# Patient Record
Sex: Female | Born: 1955 | Race: White | Hispanic: No | Marital: Married | Smoking: Never smoker
Health system: Southern US, Community
[De-identification: ages and names within clinical notes are randomized; demographics above are authoritative.]

## PROBLEM LIST (undated history)

## (undated) DIAGNOSIS — E119 Type 2 diabetes mellitus without complications: Secondary | ICD-10-CM

## (undated) DIAGNOSIS — E785 Hyperlipidemia, unspecified: Secondary | ICD-10-CM

## (undated) DIAGNOSIS — K900 Celiac disease: Secondary | ICD-10-CM

## (undated) DIAGNOSIS — K5090 Crohn's disease, unspecified, without complications: Secondary | ICD-10-CM

## (undated) HISTORY — DX: Crohn's disease, unspecified, without complications: K50.90

## (undated) HISTORY — DX: Hyperlipidemia, unspecified: E78.5

## (undated) HISTORY — DX: Celiac disease: K90.0

## (undated) HISTORY — PX: TONSILLECTOMY: SUR1361

## (undated) HISTORY — DX: Type 2 diabetes mellitus without complications: E11.9

---

## 1998-11-17 ENCOUNTER — Other Ambulatory Visit: Admission: RE | Admit: 1998-11-17 | Discharge: 1998-11-17 | Payer: Self-pay | Admitting: Gynecology

## 2000-10-24 ENCOUNTER — Other Ambulatory Visit: Admission: RE | Admit: 2000-10-24 | Discharge: 2000-10-24 | Payer: Self-pay | Admitting: Gynecology

## 2002-10-19 ENCOUNTER — Other Ambulatory Visit: Admission: RE | Admit: 2002-10-19 | Discharge: 2002-10-19 | Payer: Self-pay | Admitting: Gynecology

## 2004-05-02 ENCOUNTER — Other Ambulatory Visit: Admission: RE | Admit: 2004-05-02 | Discharge: 2004-05-02 | Payer: Self-pay | Admitting: Gynecology

## 2005-06-25 ENCOUNTER — Other Ambulatory Visit: Admission: RE | Admit: 2005-06-25 | Discharge: 2005-06-25 | Payer: Self-pay | Admitting: Gynecology

## 2006-07-09 ENCOUNTER — Other Ambulatory Visit: Admission: RE | Admit: 2006-07-09 | Discharge: 2006-07-09 | Payer: Self-pay | Admitting: Gynecology

## 2006-10-27 ENCOUNTER — Ambulatory Visit: Payer: Self-pay | Admitting: Internal Medicine

## 2006-11-14 ENCOUNTER — Ambulatory Visit (HOSPITAL_COMMUNITY): Admission: RE | Admit: 2006-11-14 | Discharge: 2006-11-14 | Payer: Self-pay | Admitting: Gastroenterology

## 2007-07-21 ENCOUNTER — Other Ambulatory Visit: Admission: RE | Admit: 2007-07-21 | Discharge: 2007-07-21 | Payer: Self-pay | Admitting: Gynecology

## 2008-08-15 ENCOUNTER — Other Ambulatory Visit: Admission: RE | Admit: 2008-08-15 | Discharge: 2008-08-15 | Payer: Self-pay | Admitting: Gynecology

## 2008-11-01 ENCOUNTER — Ambulatory Visit: Payer: Self-pay | Admitting: Gynecology

## 2008-11-09 ENCOUNTER — Ambulatory Visit: Payer: Self-pay | Admitting: Gynecology

## 2008-11-21 ENCOUNTER — Ambulatory Visit: Payer: Self-pay | Admitting: Gynecology

## 2009-08-25 ENCOUNTER — Encounter: Payer: Self-pay | Admitting: Gynecology

## 2009-08-25 ENCOUNTER — Ambulatory Visit: Payer: Self-pay | Admitting: Gynecology

## 2009-08-25 ENCOUNTER — Other Ambulatory Visit: Admission: RE | Admit: 2009-08-25 | Discharge: 2009-08-25 | Payer: Self-pay | Admitting: Gynecology

## 2009-10-27 ENCOUNTER — Ambulatory Visit: Payer: Self-pay | Admitting: Gynecology

## 2009-11-14 ENCOUNTER — Ambulatory Visit: Payer: Self-pay | Admitting: Gynecology

## 2009-11-14 ENCOUNTER — Encounter (INDEPENDENT_AMBULATORY_CARE_PROVIDER_SITE_OTHER): Payer: Self-pay | Admitting: *Deleted

## 2009-11-14 ENCOUNTER — Encounter: Payer: Self-pay | Admitting: Family

## 2009-11-14 LAB — CONVERTED CEMR LAB
Cholesterol: 276 mg/dL
HDL: 34 mg/dL
MCH: 29.3 pg
MCV: 86.5 fL
Platelets: 494 10*3/uL
Triglycerides: 291 mg/dL
WBC, blood: 5.5 10*3/uL

## 2009-11-21 ENCOUNTER — Encounter (INDEPENDENT_AMBULATORY_CARE_PROVIDER_SITE_OTHER): Payer: Self-pay | Admitting: *Deleted

## 2009-12-04 ENCOUNTER — Ambulatory Visit: Payer: Self-pay | Admitting: Family

## 2009-12-04 ENCOUNTER — Encounter: Payer: Self-pay | Admitting: Family Medicine

## 2009-12-04 DIAGNOSIS — L259 Unspecified contact dermatitis, unspecified cause: Secondary | ICD-10-CM

## 2009-12-04 DIAGNOSIS — E785 Hyperlipidemia, unspecified: Secondary | ICD-10-CM | POA: Insufficient documentation

## 2009-12-04 DIAGNOSIS — K900 Celiac disease: Secondary | ICD-10-CM

## 2009-12-04 LAB — CONVERTED CEMR LAB
AST: 27 units/L (ref 0–37)
CO2: 27 meq/L (ref 19–32)
Chloride: 104 meq/L (ref 96–112)
GFR calc non Af Amer: 92.69 mL/min (ref >60)
Glucose, Bld: 103 mg/dL — ABNORMAL HIGH (ref 70–99)
Potassium: 4.9 meq/L (ref 3.5–5.1)

## 2010-02-14 ENCOUNTER — Telehealth (INDEPENDENT_AMBULATORY_CARE_PROVIDER_SITE_OTHER): Payer: Self-pay | Admitting: *Deleted

## 2010-02-26 ENCOUNTER — Ambulatory Visit: Payer: Self-pay | Admitting: Family Medicine

## 2010-02-26 DIAGNOSIS — R5383 Other fatigue: Secondary | ICD-10-CM

## 2010-02-26 DIAGNOSIS — R5381 Other malaise: Secondary | ICD-10-CM

## 2010-02-27 ENCOUNTER — Ambulatory Visit: Payer: Self-pay | Admitting: Family Medicine

## 2010-03-08 ENCOUNTER — Telehealth (INDEPENDENT_AMBULATORY_CARE_PROVIDER_SITE_OTHER): Payer: Self-pay | Admitting: *Deleted

## 2010-03-08 LAB — CONVERTED CEMR LAB
ALT: 40 units/L — ABNORMAL HIGH (ref 0–35)
Alkaline Phosphatase: 66 units/L (ref 39–117)
Basophils Absolute: 0 10*3/uL (ref 0.0–0.1)
Basophils Relative: 0.8 % (ref 0.0–3.0)
Cholesterol: 175 mg/dL (ref 0–200)
Eosinophils Absolute: 0.2 10*3/uL (ref 0.0–0.7)
Ferritin: 12.8 ng/mL (ref 10.0–291.0)
HCT: 40.2 % (ref 36.0–46.0)
Hemoglobin: 13.2 g/dL (ref 12.0–15.0)
LDL Cholesterol: 94 mg/dL (ref 0–99)
Lymphs Abs: 1.7 10*3/uL (ref 0.7–4.0)
MCHC: 32.9 g/dL (ref 30.0–36.0)
Monocytes Absolute: 0.6 10*3/uL (ref 0.1–1.0)
Monocytes Relative: 9.9 % (ref 3.0–12.0)
Platelets: 416 10*3/uL — ABNORMAL HIGH (ref 150.0–400.0)
Total CHOL/HDL Ratio: 4
Total Protein: 7 g/dL (ref 6.0–8.3)
WBC: 6.2 10*3/uL (ref 4.5–10.5)

## 2010-03-09 ENCOUNTER — Telehealth: Payer: Self-pay | Admitting: Family Medicine

## 2010-04-22 ENCOUNTER — Emergency Department (HOSPITAL_COMMUNITY): Admission: EM | Admit: 2010-04-22 | Discharge: 2010-04-22 | Payer: Self-pay | Admitting: Emergency Medicine

## 2010-05-22 ENCOUNTER — Telehealth (INDEPENDENT_AMBULATORY_CARE_PROVIDER_SITE_OTHER): Payer: Self-pay | Admitting: *Deleted

## 2010-05-29 ENCOUNTER — Telehealth (INDEPENDENT_AMBULATORY_CARE_PROVIDER_SITE_OTHER): Payer: Self-pay | Admitting: *Deleted

## 2010-06-19 ENCOUNTER — Ambulatory Visit: Payer: Self-pay | Admitting: Family Medicine

## 2010-06-20 LAB — CONVERTED CEMR LAB
ALT: 32 units/L (ref 0–35)
Albumin: 4.5 g/dL (ref 3.5–5.2)
Alkaline Phosphatase: 66 units/L (ref 39–117)
Bilirubin, Direct: 0.1 mg/dL (ref 0.0–0.3)
HDL: 42.9 mg/dL (ref >39.00)
LDL Cholesterol: 97 mg/dL (ref 0–99)
Total Bilirubin: 0.3 mg/dL (ref 0.3–1.2)
Total CHOL/HDL Ratio: 4
Triglycerides: 83 mg/dL (ref 0.0–149.0)

## 2010-06-25 ENCOUNTER — Telehealth (INDEPENDENT_AMBULATORY_CARE_PROVIDER_SITE_OTHER): Payer: Self-pay | Admitting: *Deleted

## 2010-06-26 IMAGING — CR DG LUMBAR SPINE COMPLETE 4+V
5 series · 5 of 5 positions shown · non-contrast
Comparison: None

CLINICAL DATA: Back pain

LUMBAR SPINE - COMPLETE 4+ VIEW

[t l-spine a.p.]
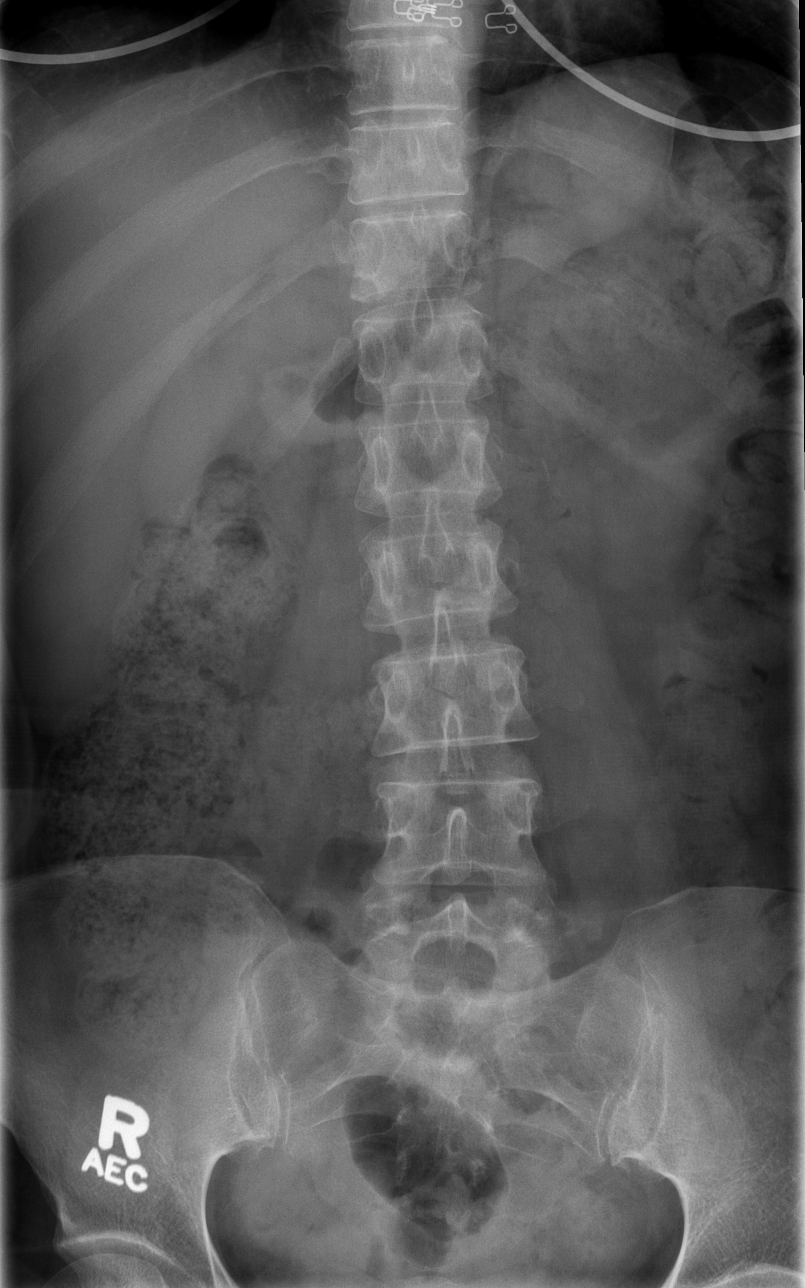

[t l-spine oblique exposure (1 of 2)]
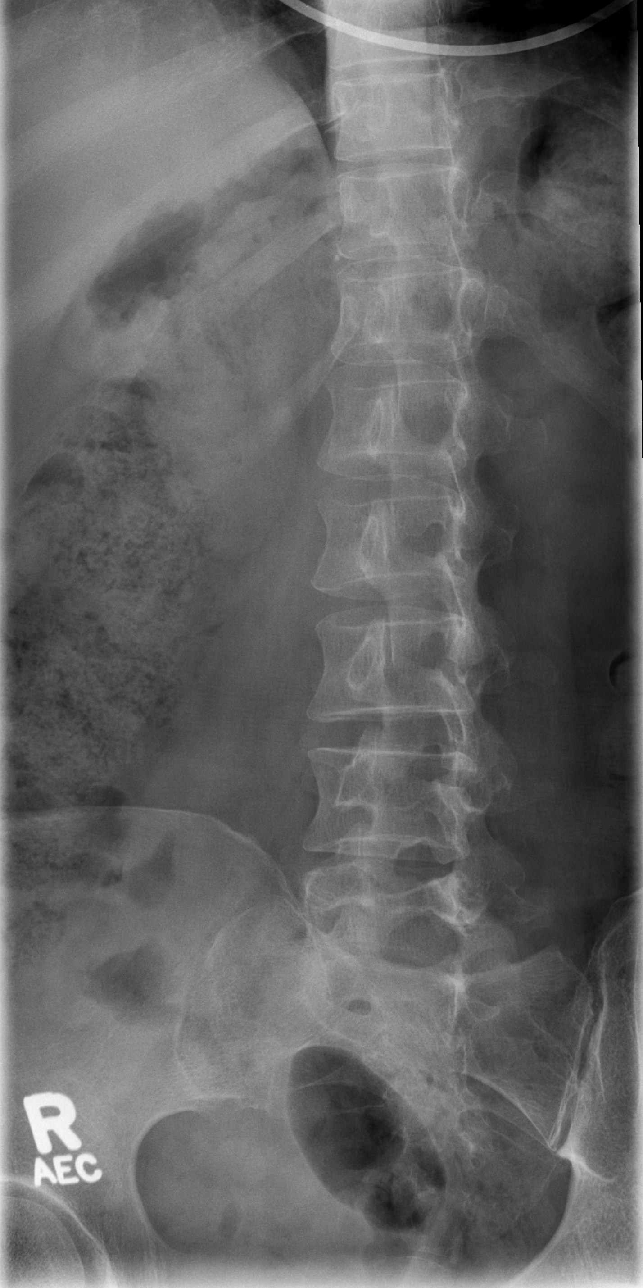

[t l-spine oblique exposure (2 of 2)]
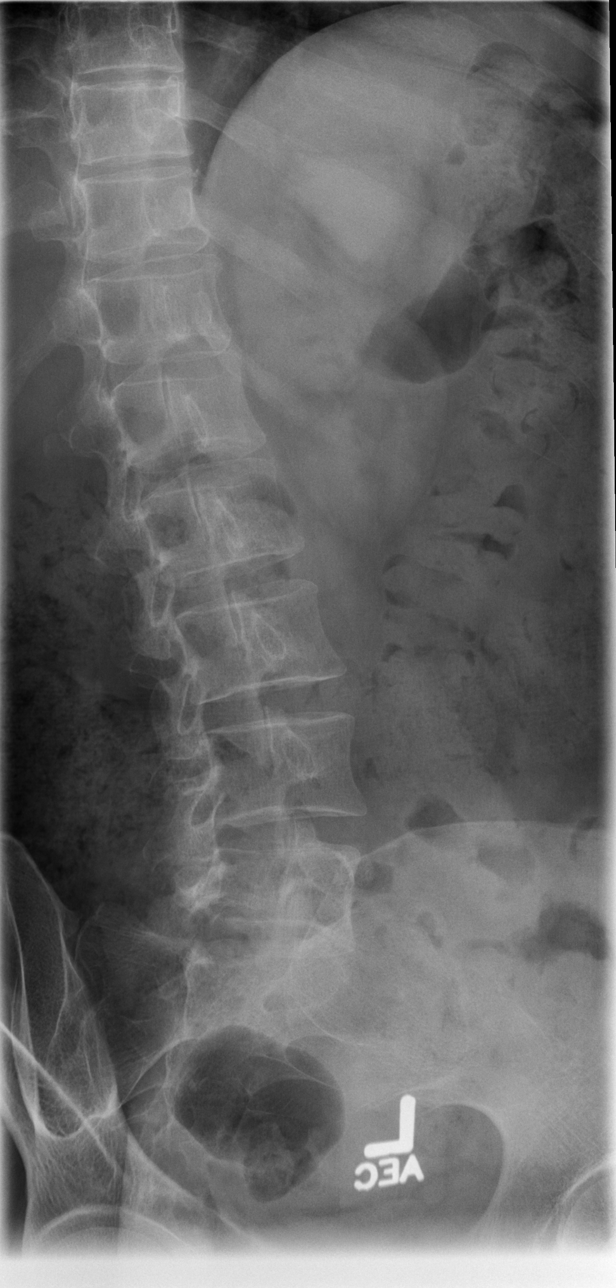

[t l-spine lat]
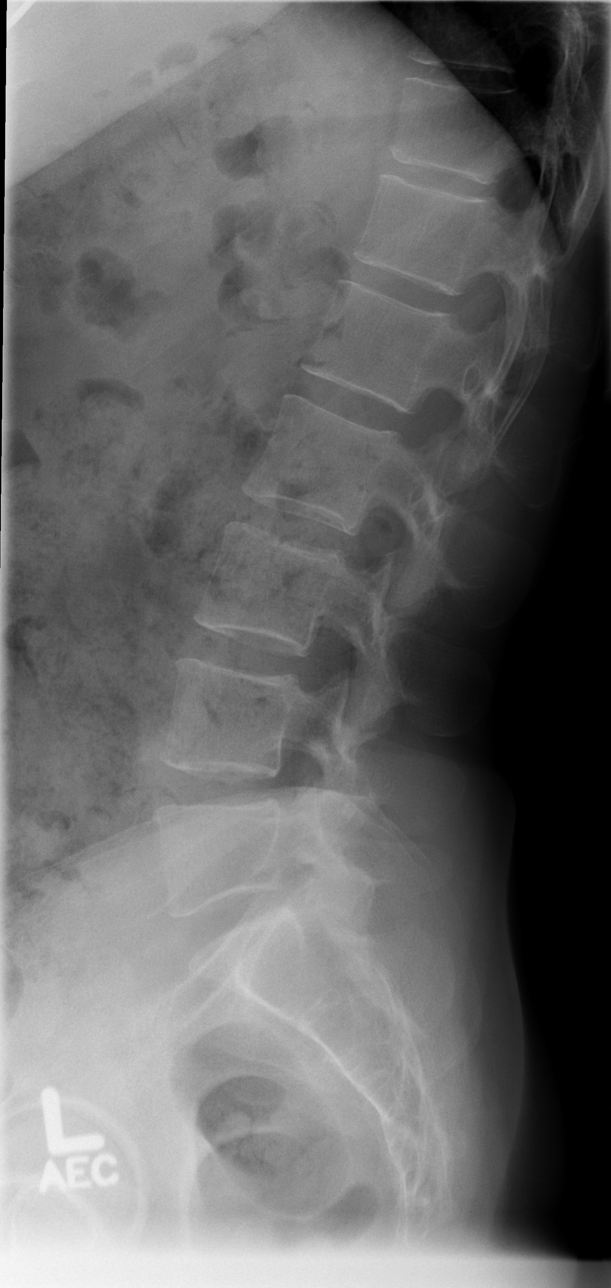

[t l-spine l5-s1 spot]
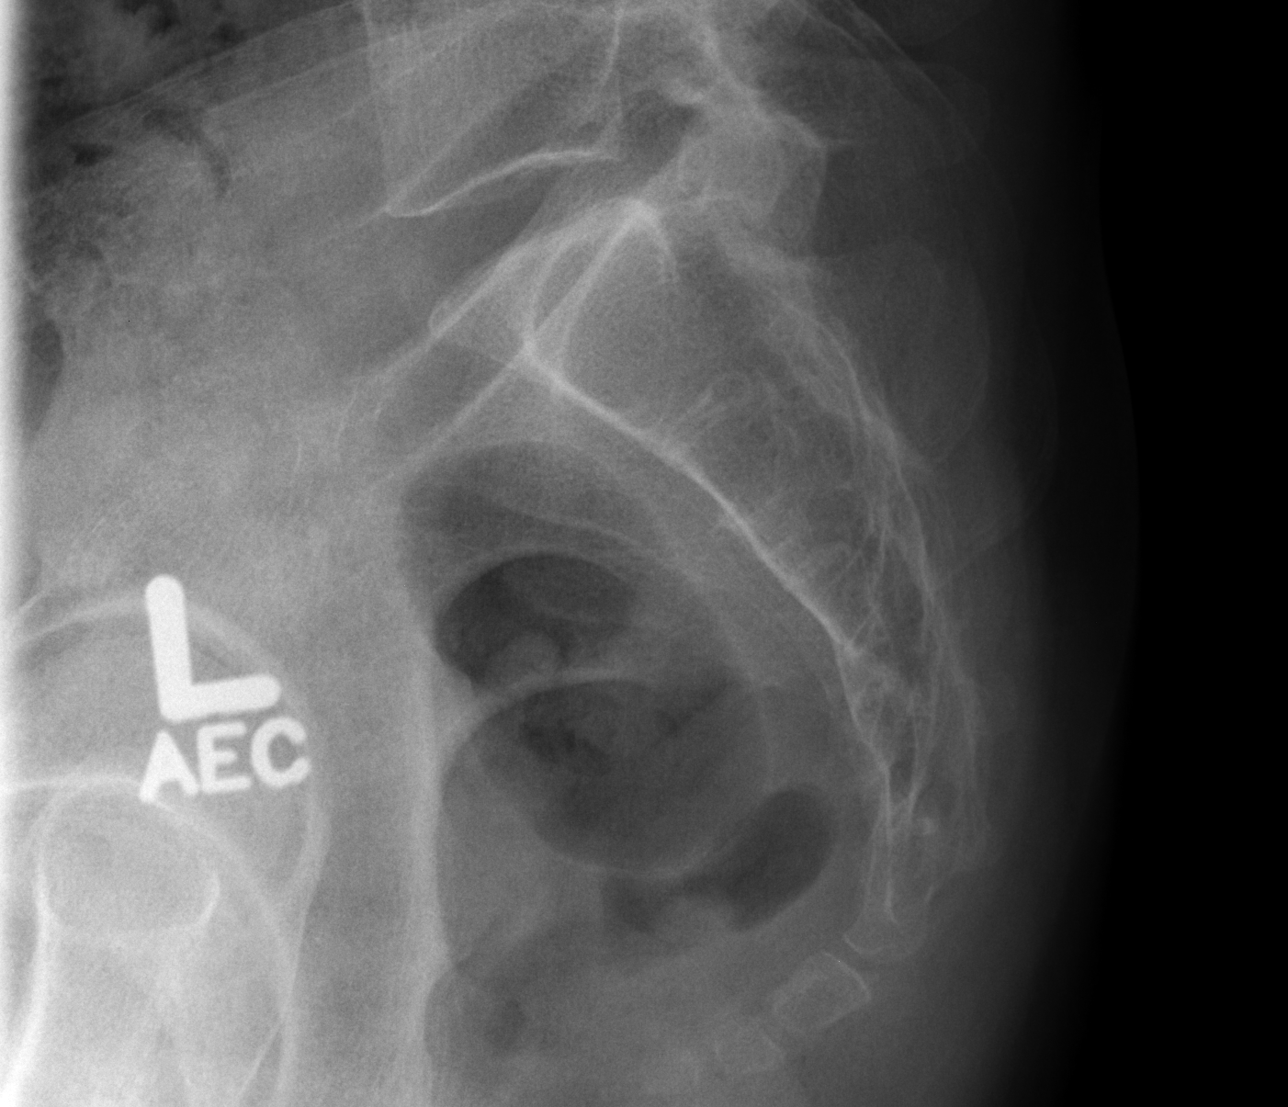

[5 of 5 positions shown; findings below may reference images not displayed]

FINDINGS: There is a mild curvature of the lumbar spine which is
convex to the left.

The vertebral body heights and disc spaces are well preserved.

No fractures or dislocations identified.
IMPRESSION: 1.  No acute findings.

## 2010-07-24 ENCOUNTER — Telehealth (INDEPENDENT_AMBULATORY_CARE_PROVIDER_SITE_OTHER): Payer: Self-pay | Admitting: *Deleted

## 2010-08-29 ENCOUNTER — Encounter: Payer: Self-pay | Admitting: Family Medicine

## 2010-08-29 ENCOUNTER — Encounter (INDEPENDENT_AMBULATORY_CARE_PROVIDER_SITE_OTHER): Payer: Self-pay | Admitting: *Deleted

## 2010-08-29 ENCOUNTER — Other Ambulatory Visit: Admission: RE | Admit: 2010-08-29 | Discharge: 2010-08-29 | Payer: Self-pay | Admitting: Gynecology

## 2010-08-29 ENCOUNTER — Ambulatory Visit: Payer: Self-pay | Admitting: Gynecology

## 2010-08-29 LAB — CONVERTED CEMR LAB
Glucose, Bld: 105 mg/dL
MCV: 88.6 fL
TSH: 1.65 microintl units/mL
WBC, blood: 7.2 10*3/uL

## 2010-09-06 ENCOUNTER — Encounter (INDEPENDENT_AMBULATORY_CARE_PROVIDER_SITE_OTHER): Payer: Self-pay | Admitting: *Deleted

## 2010-12-25 ENCOUNTER — Ambulatory Visit: Payer: Self-pay | Admitting: Family Medicine

## 2011-01-14 ENCOUNTER — Ambulatory Visit: Admit: 2011-01-14 | Payer: Self-pay | Admitting: Family Medicine

## 2011-01-29 NOTE — Assessment & Plan Note (Signed)
Summary: NEW PT AND MED REFILL//PH   Vital Signs:  Patient profile:   55 year old female Weight:      138.13 pounds Temp:     98.2 degrees F oral Pulse rate:   78 / minute Pulse rhythm:   regular BP sitting:   118 / 74  (left arm) Cuff size:   large  Vitals Entered By: Army Fossa CMA (February 26, 2010 2:18 PM) CC: Pt here to discuss Cholesterol readlings- Melissa started her on Zocor.   History of Present Illness: Pt here to have cholesterol checked but she is not fasting.  Pt also c/o fatigue.  No other complaints.    Current Medications (verified): 1)  Actoplus Met 15-500 Mg Tabs (Pioglitazone Hcl-Metformin Hcl) .Marland Kitchen.. 1 Tab By Mouth Once A Month 2)  Daily Multiple Vitamins  Tabs (Multiple Vitamin) .Marland Kitchen.. 1 Tab By Mouth Daily 3)  Caltrate 600+d 600-400 Mg-Unit Tabs (Calcium Carbonate-Vitamin D) .Marland Kitchen.. 1 Tab By Mouth Daily 4)  Glucosamine-Chondroitin 500-400 Mg Caps (Glucosamine-Chondroitin) .Marland Kitchen.. 1or 2 A Day By Mouth For Joint Pain 5)  Zocor 20 Mg Tabs (Simvastatin) .... One Tablet By Mouth Daily  Allergies (verified): No Known Drug Allergies  Past History:  Social History: Last updated: 12/04/2009 Works at [employer] of [employer] for [employer] never smoked two daughters Married does not drink alcohol no drugs  Past medical, surgical, family and social histories (including risk factors) reviewed for relevance to current acute and chronic problems.  Past Medical History: Reviewed history from 12/04/2009 and no changes required. 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 tonsillectomy age 25  Family History: Reviewed history from 12/04/2009 and no changes required. Mom- Living,  breast CA, now undergoing evaluation for ? COPD, HTN Dad- deceased, history of Mitral valve  replacement- died from stroke following a mitral valve replacement, age 57, HTN 2Brothers- younger brother overweight Sister- Healthy 2 daughters- alive and well  Social History: Reviewed history from 12/04/2009 and no changes required. Works at [employer] for [employer] never smoked two daughters Married does not drink alcohol no drugs  Review of Systems      See HPI  Physical Exam  General:  Well-developed,well-nourished,in no acute distress; alert,appropriate and cooperative throughout examination Psych:  Cognition and judgment appear intact. Alert and cooperative with normal attention span and concentration. No apparent delusions, illusions, hallucinations   Impression & Recommendations:  Problem # 1:  HYPERLIPIDEMIA (ICD-272.4) check fasting labs Her updated medication list for this problem includes:    Zocor 20 Mg Tabs (Simvastatin) ..... One tablet by mouth daily  Labs Reviewed: SGOT: 27 (12/04/2009)   SGPT: 27 (12/04/2009)   HDL:34 (11/14/2009)  LDL:184 (11/14/2009)  Chol:276 (11/14/2009)  Trig:291 (11/14/2009)  Problem # 2:  FATIGUE (ICD-780.79) check labs  Complete Medication List: 1)  Actoplus Met 15-500 Mg Tabs (Pioglitazone hcl-metformin hcl) .Marland Kitchen.. 1 tab by mouth once a month 2)  Daily Multiple Vitamins Tabs (Multiple vitamin) .Marland Kitchen.. 1 tab by mouth daily 3)  Caltrate 600+d 600-400 Mg-unit Tabs (Calcium carbonate-vitamin d) .Marland Kitchen.. 1 tab by mouth daily 4)  Glucosamine-chondroitin 500-400 Mg Caps (Glucosamine-chondroitin) .Marland Kitchen.. 1or 2 a day by mouth for joint pain 5)  Zocor 20 Mg Tabs (Simvastatin) .... One tablet by mouth daily  Patient Instructions: 1)  fasting labs at [facility]---- 272.4  lipid, hep 780.79  cbcd,  tsh, b12, ibc, ferritin

## 2011-01-29 NOTE — Progress Notes (Signed)
Summary: ? possible med interaction  Phone Note Call from Patient Call back at [phone]   Caller: Patient Summary of Call: pt want to make sure it is ok to take but the zocor and the tricor at the same time. Pt wants to know if this will have any effect on her celiac disease as well.per pt ok to leace VM. pls advise.........Marland KitchenFelecia Deloach CMA  March 09, 2010 1:00 PM   Follow-up for Phone Call        yes we give those together all the time .  Should not bother celiac. Follow-up by: Loreen Freud DO,  March 09, 2010 1:03 PM  Additional Follow-up for Phone Call Additional follow up Details #1::        left pt detail message..............Marland KitchenFelecia Deloach CMA  March 09, 2010 2:20 PM

## 2011-01-29 NOTE — Progress Notes (Signed)
Summary: zocor refill   Phone Note Refill Request Message from:  Fax from Pharmacy on February 14, 2010 8:15 AM  Refills Requested: Medication #1:  simvastatin 20 mg tablet   Brand Name Necessary? No   Supply Requested: 1 month   Last Refilled: 01/02/2010  Method Requested: Electronic Next Appointment Scheduled: 2-28- 215  Dr Laury Axon Initial call taken by: Roselle Locus,  February 14, 2010 8:15 AM    Prescriptions: ZOCOR 20 MG TABS (SIMVASTATIN) one tablet by mouth daily  #30 x 1   Entered by:   Doristine Devoid   Authorized by:   Lemont Fillers FNP   Signed by:   Doristine Devoid on 02/14/2010   Method used:   Electronically to        [id number]       [address]       [place], [place]  [address]       Ph: [phone] or [phone]       Fax: [phone]   RxID:   [id number]

## 2011-01-29 NOTE — Progress Notes (Signed)
Summary: REFILL REQUEST  Phone Note Refill Request Call back at [phone] Message from:  Pharmacy on June 25, 2010 3:17 PM  Refills Requested: Medication #1:  ZOCOR 20 MG TABS one tablet by mouth daily   Dosage confirmed as above?Dosage Confirmed   Supply Requested: 1 month   Last Refilled: 05/22/2010 [facility]  Next Appointment Scheduled: NONE Initial call taken by: Lavell Islam,  June 25, 2010 3:17 PM    Prescriptions: ZOCOR 20 MG TABS (SIMVASTATIN) one tablet by mouth daily  #30 x 11   Entered by:   Shonna Chock   Authorized by:   Neena Rhymes MD   Signed by:   Shonna Chock on 06/25/2010   Method used:   Electronically to        [id number] ([facility]       [address]       [place], [place]  [address]       Ph: [phone] or [phone]       Fax: [phone]   RxID:   [id number]

## 2011-01-29 NOTE — Progress Notes (Signed)
Summary: tricor refill   Phone Note Refill Request Call back at [phone] Message from:  Pharmacy on May 29, 2010 9:14 AM  Refills Requested: Medication #1:  TRICOR 145 MG TABS Take 1 by mouth  once daily.   Dosage confirmed as above?Dosage Confirmed   Supply Requested: 3 months   Notes: Pt needs a 90 supply for insuance to cover [facility] [address]   Next Appointment Scheduled: 6.21.11 Initial call taken by: Harold Barban,  May 29, 2010 9:14 AM    Prescriptions: TRICOR 145 MG TABS (FENOFIBRATE) Take 1 by mouth  once daily  #90 x 0   Entered by:   Doristine Devoid   Authorized by:   Neena Rhymes MD   Signed by:   Doristine Devoid on 05/29/2010   Method used:   Electronically to        [id number]       [address]       [place], [place]  [address]       Ph: [phone] or [phone]       Fax: [phone]   RxID:   [id number]

## 2011-01-29 NOTE — Miscellaneous (Signed)
Summary: labs from Dr. Lily Peer  Clinical Lists Changes  Observations: Added new observation of VIT D 1,25OH: 24  (08/29/2010 15:00) Added new observation of TSH: 1.65 microintl units/mL (08/29/2010 15:00) Added new observation of GLUCOSE SER: 105 mg/dL (09/81/1914 78:29) Added new observation of PLATELETS: 466 10*3/mm3 (08/29/2010 15:00) Added new observation of MCH: 30.1 pg (08/29/2010 15:00) Added new observation of MCV: 88.6 fL (08/29/2010 15:00) Added new observation of HCT: 41.7 % (08/29/2010 15:00) Added new observation of HGB: 14.2 g/dL (56/21/3086 57:84) Added new observation of RBC: 4.71 10*6/mm3 (08/29/2010 15:00) Added new observation of WBC: 7.2 10*3/mm3 (08/29/2010 15:00)

## 2011-01-29 NOTE — Progress Notes (Signed)
Summary: Lab Results (lmom 3/10)  Phone Note Outgoing Call   Call placed by: Army Fossa CMA,  March 08, 2010 8:34 AM Summary of Call: Regarding lab results, LMTCB:  Ideally your LDL (bad cholesterol) should be <__100__, your HDL (good cholesterol) should be >__40_ and your triglycerides should be< 150.  Diet and exercise will increase HDL and decrease the LDL and triglycerides. Read Dr. Danice Goltz book--Eat Drink and Be Healthy. con't zocor--- add tricor 145 mg #30 1 by mouth  once daily ,  2 refills.  We will recheck labs in _3__ months.  lipid, hep,  272.4 Signed by Loreen Freud DO on 03/07/2010 at 8:09 PM   Follow-up for Phone Call        pt aware, letter mailed, rx sent to pharmacy..............Marland KitchenFelecia Deloach CMA  March 09, 2010 9:29 AM     New/Updated Medications: TRICOR 145 MG TABS (FENOFIBRATE) Take 1 by mouth  once daily Prescriptions: TRICOR 145 MG TABS (FENOFIBRATE) Take 1 by mouth  once daily  #30 x 2   Entered by:   Jeremy Johann CMA   Authorized by:   Loreen Freud DO   Signed by:   Jeremy Johann CMA on 03/09/2010   Method used:   Faxed to ...       [id number]       [address]       [place], [place]  [address]       Ph: [phone] or [phone]       Fax: [phone]   RxID:   [id number]

## 2011-01-29 NOTE — Progress Notes (Signed)
Summary: left msg for pt to call //ov scheduled  Phone Note Call from Patient Call back at [phone]   Caller: Patient Summary of Call: pt called and left msg would like Dr Beverely Low to schedule labwork at [facility]. Called pt back got VM left msg for pt to call .Kandice Hams  May 22, 2010 10:35 AM pt called back informed will need ov with Dr Beverely Low, pt will check schedule at work and call back .Kandice Hams  May 22, 2010 1:29 PM    Initial call taken by: Kandice Hams,  May 22, 2010 10:35 AM  Follow-up for Phone Call        pt called back scheduled ov,  will come in fasting.  Pt needs rf of cholesterol med until ov [facility] [address] .Kandice Hams  May 22, 2010 2:29 PM  Follow-up by: Kandice Hams,  May 22, 2010 2:29 PM    Prescriptions: TRICOR 145 MG TABS (FENOFIBRATE) Take 1 by mouth  once daily  #30 x 0   Entered by:   Kandice Hams   Authorized by:   Neena Rhymes MD   Signed by:   Kandice Hams on 05/22/2010   Method used:   Faxed to ...       [id number]       [address]       [place], [place]  [address]       Ph: [phone] or [phone]       Fax: [phone]   RxID:   [id number] ZOCOR 20 MG TABS (SIMVASTATIN) one tablet by mouth daily  #30 x 0   Entered by:   Kandice Hams   Authorized by:   Neena Rhymes MD   Signed by:   Kandice Hams on 05/22/2010   Method used:   Faxed to ...       [id number]       [address]       [place], [place]  [address]       Ph: [phone] or [phone]       Fax: [phone]   RxID:   [id number]

## 2011-01-29 NOTE — Progress Notes (Signed)
Summary: simvastatin refill   Phone Note Refill Request Call back at [phone] Message from:  Pharmacy on July 24, 2010 8:22 AM  Refills Requested: Medication #1:  ZOCOR 20 MG TABS one tablet by mouth daily   Dosage confirmed as above?Dosage Confirmed   Supply Requested: 3 months   Last Refilled: 06/25/2010   Notes: INSURANCE WILL ONLY COVER AS 90 DAY SUPPLY NEED NEW RX FOR #[facility]  Next Appointment Scheduled: December 27th 2011 Initial call taken by: Lavell Islam,  July 24, 2010 8:23 AM    Prescriptions: ZOCOR 20 MG TABS (SIMVASTATIN) one tablet by mouth daily  #90 x 3   Entered by:   Doristine Devoid CMA   Authorized by:   Neena Rhymes MD   Signed by:   Doristine Devoid CMA on 07/24/2010   Method used:   Electronically to        [id number]       [address]       [place], [place]  [address]       Ph: [phone] or [phone]       Fax: [phone]   RxID:   [id number]

## 2011-01-29 NOTE — Assessment & Plan Note (Signed)
Summary: followup on cholesterol med/alr   Vital Signs:  Patient profile:   55 year old female Weight:      132 pounds Pulse rate:   66 / minute BP sitting:   122 / 72  (left arm)  Vitals Entered By: Doristine Devoid (June 19, 2010 11:05 AM)  History of Present Illness: 55 yo woman here today for f/u on lipids.  has strong family hx, watching diet, exercising on the weekends.  tolerating meds w/out difficulty.  has run out of Tricor- off the med for 6 weeks.  no N/V, myalgias.  Preventive Screening-Counseling & Management  Caffeine-Diet-Exercise     Does Patient Exercise: yes     Type of exercise: walking      Drug Use:  never.    Problems Prior to Update: 1)  Fatigue  (ICD-780.79) 2)  Eczema  (ICD-692.9) 3)  Physical Examination  (ICD-V70.0) 4)  Hyperlipidemia  (ICD-272.4) 5)  Celiac Disease  (ICD-579.0)  Current Medications (verified): 1)  Actoplus Met 15-500 Mg Tabs (Pioglitazone Hcl-Metformin Hcl) .Marland Kitchen.. 1 Tab By Mouth Once A Month 2)  Daily Multiple Vitamins  Tabs (Multiple Vitamin) .Marland Kitchen.. 1 Tab By Mouth Daily 3)  Caltrate 600+d 600-400 Mg-Unit Tabs (Calcium Carbonate-Vitamin D) .Marland Kitchen.. 1 Tab By Mouth Daily 4)  Glucosamine-Chondroitin 500-400 Mg Caps (Glucosamine-Chondroitin) .Marland Kitchen.. 1or 2 A Day By Mouth For Joint Pain 5)  Zocor 20 Mg Tabs (Simvastatin) .... One Tablet By Mouth Daily 6)  Tricor 145 Mg Tabs (Fenofibrate) .... 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 COPD, HTN Dad-  deceased, history of Mitral valve replacement- died from stroke following a mitral valve replacement, age 52, HTN 2Brothers- younger brother overweight Sister- Healthy 2 daughters- alive and well Hyperlipidemia- mother, father  Social History: Does Patient Exercise:  yes Drug Use:  never  Review of Systems      See HPI  Physical Exam  General:  Well-developed,well-nourished,in no acute distress; alert,appropriate and cooperative throughout examination Lungs:  Normal respiratory effort, chest expands symmetrically. Lungs are clear to auscultation, no crackles or wheezes. Heart:  Normal rate and regular rhythm. S1 and S2 normal without gallop, murmur, click, rub or other extra sounds. Abdomen:  Bowel sounds positive,abdomen soft and non-tender without masses, organomegaly or hernias noted. Pulses:  +2 carotid, radial, DP Extremities:  no C/C/E   Impression & Recommendations:  Problem # 1:  HYPERLIPIDEMIA (ICD-272.4) Assessment Unchanged tolerating Zocor w/out difficulty.  holding tricor- wants to see liver enzymes first.  check labs and adjust meds as needed. Her updated medication list for this problem includes:    Zocor 20 Mg Tabs (Simvastatin) ..... One tablet by mouth daily    Tricor 145 Mg Tabs (Fenofibrate) .Marland Kitchen... Take 1 by mouth  once daily  Orders: Venipuncture ([address]) TLB-Lipid Panel (80061-LIPID) TLB-Hepatic/Liver Function Pnl (80076-HEPATIC)  Complete Medication List: 1)  Actoplus Met 15-500 Mg Tabs (Pioglitazone hcl-metformin hcl) .Marland Kitchen.. 1 tab by mouth once a month 2)  Daily Multiple Vitamins Tabs (Multiple vitamin) .Marland Kitchen.. 1 tab by mouth daily 3)  Caltrate 600+d 600-400 Mg-unit  Tabs (Calcium carbonate-vitamin d) .Marland Kitchen.. 1 tab by mouth daily 4)  Glucosamine-chondroitin 500-400 Mg Caps (Glucosamine-chondroitin) .Marland Kitchen.. 1or 2 a day by mouth for joint pain 5)  Zocor 20 Mg Tabs (Simvastatin) .... One tablet by mouth daily 6)  Tricor 145 Mg Tabs (Fenofibrate) .... Take 1 by mouth   once daily  Patient Instructions: 1)  Follow up in December for your complete physical- do not eat before that appt 2)  Keep up the good work on diet and exercise- you look great! 3)  We'll notify you of your lab results 4)  Call with any questions or concerns 5)  Have a great summer!!

## 2011-05-17 NOTE — Op Note (Signed)
NAME:  Terri Hart, DEBBIE                ACCOUNT NO.:  [id number]   MEDICAL RECORD NO.:  [id number]          PATIENT TYPE:  AMB   LOCATION:  ENDO                         FACILITY:  [facility]   PHYSICIAN:  Anselmo Rod, M.D.  DATE OF BIRTH:  08/07/1956   DATE OF PROCEDURE:  11/14/2006  DATE OF DISCHARGE:                                 OPERATIVE REPORT   PROCEDURE PERFORMED:  Screening colonoscopy.   ENDOSCOPIST:  Anselmo Rod, M.D.   INSTRUMENT USED:  Olympus video colonoscope.   INDICATIONS FOR PROCEDURE:  A 55 year old white female underwent a screening  colonoscopy to rule out colonic polyps, masses, etc.   PRE-PROCEDURE PREPARATION:  Informed consent was procured from the patient.  The patient was fasted for eight hours prior to the procedure and prepped  with Dulcolax pills and a gallon of TriLyte the night prior to the  procedure.  Risks and benefits of the procedure, including a 10% missed rate  of cancer and polyps, were discussed with the patient as well.   PRE-PROCEDURE PHYSICAL EXAMINATION:  VITAL SIGNS:  Stable vital signs.  NECK:  Supple.  CHEST:  Clear to auscultation.  HEART:  S1 and S2.  Regular.  ABDOMEN:  Soft with normal bowel sounds.   DESCRIPTION OF PROCEDURE:  The patient was placed in the left lateral  decubitus position and sedated with 75 mcg of fentanyl and 8 mg of Versed in  slow, incremental doses.  Once the patient was adequately sedated,  maintained on low flow oxygen, and continuous cardiac monitoring, the  Olympus video colonoscope was advanced from the rectum to the cecum.  The  appendicle orifices and ileocecal valve were clearly visualized and  photographed.  Multiple washings were done.  The terminal ileum appeared  healthy without lesions.  No masses, polyps, erosions, ulcers, or  diverticula were seen.  Small internal hemorrhoids were appreciated on  retroflexion in the rectum.  The patient tolerated the procedure well  without immediate  complications.   IMPRESSION:  1. Normal colonoscopy up to the terminal ileum.  No masses, polyps, or      diverticula seen.  2. A small internal hemorrhoid seen on retroflexion.   RECOMMENDATIONS:  1. Continue on a high-fiber diet with liberal fluid intake.  2. Repeat colonoscopy in the next 10 years unless the patient develops any      abnormal symptoms in the interim, in which case she should contact the      office immediately for further recommendations.  3. Outpatient followup as need arises in the future.      Anselmo Rod, M.D.  Electronically Signed     JNM/MEDQ  D:  11/14/2006  T:  11/14/2006  Job:  [id number]   cc:   Gaetano Hawthorne. Lily Peer, M.D.  Corwin Levins, MD

## 2011-07-25 ENCOUNTER — Encounter: Payer: Self-pay | Admitting: Gynecology

## 2011-08-05 ENCOUNTER — Other Ambulatory Visit: Payer: Self-pay | Admitting: Family Medicine

## 2011-08-06 ENCOUNTER — Other Ambulatory Visit: Payer: Self-pay

## 2011-08-06 MED ORDER — SIMVASTATIN 20 MG PO TABS: 20.0000 mg | ORAL_TABLET | Freq: Every evening | ORAL | Status: DC

## 2011-08-06 NOTE — Telephone Encounter (Signed)
Labs due    KP 

## 2011-08-28 ENCOUNTER — Encounter: Payer: Self-pay | Admitting: Anesthesiology

## 2011-08-28 DIAGNOSIS — K5090 Crohn's disease, unspecified, without complications: Secondary | ICD-10-CM | POA: Insufficient documentation

## 2011-08-28 DIAGNOSIS — K900 Celiac disease: Secondary | ICD-10-CM | POA: Insufficient documentation

## 2011-08-28 DIAGNOSIS — E559 Vitamin D deficiency, unspecified: Secondary | ICD-10-CM | POA: Insufficient documentation

## 2011-09-04 ENCOUNTER — Other Ambulatory Visit (HOSPITAL_COMMUNITY)
Admission: RE | Admit: 2011-09-04 | Discharge: 2011-09-04 | Disposition: A | Discharge status: Home / Self Care | Payer: 59 | Source: Ambulatory Visit | Attending: Gynecology | Admitting: Gynecology

## 2011-09-04 ENCOUNTER — Encounter: Payer: Self-pay | Admitting: Gynecology

## 2011-09-04 ENCOUNTER — Ambulatory Visit (INDEPENDENT_AMBULATORY_CARE_PROVIDER_SITE_OTHER): Payer: 59 | Admitting: Gynecology

## 2011-09-04 DIAGNOSIS — E559 Vitamin D deficiency, unspecified: Secondary | ICD-10-CM

## 2011-09-04 DIAGNOSIS — Z01419 Encounter for gynecological examination (general) (routine) without abnormal findings: Secondary | ICD-10-CM

## 2011-09-04 DIAGNOSIS — E785 Hyperlipidemia, unspecified: Secondary | ICD-10-CM

## 2011-09-04 DIAGNOSIS — R82998 Other abnormal findings in urine: Secondary | ICD-10-CM

## 2011-09-04 DIAGNOSIS — Z8739 Personal history of other diseases of the musculoskeletal system and connective tissue: Secondary | ICD-10-CM

## 2011-09-04 DIAGNOSIS — Z1211 Encounter for screening for malignant neoplasm of colon: Secondary | ICD-10-CM

## 2011-09-04 DIAGNOSIS — R635 Abnormal weight gain: Secondary | ICD-10-CM

## 2011-09-04 DIAGNOSIS — I10 Essential (primary) hypertension: Secondary | ICD-10-CM

## 2011-09-04 LAB — COMPREHENSIVE METABOLIC PANEL
BUN: 15 mg/dL (ref 6–23)
CO2: 27 mEq/L (ref 19–32)
Calcium: 10 mg/dL (ref 8.4–10.5)
Chloride: 102 mEq/L (ref 96–112)
Creat: 0.66 mg/dL (ref 0.50–1.10)
Glucose, Bld: 83 mg/dL (ref 70–99)
Total Bilirubin: 0.4 mg/dL (ref 0.3–1.2)
Total Protein: 7.6 g/dL (ref 6.0–8.3)

## 2011-09-04 NOTE — Progress Notes (Signed)
Addended by: Cammie Mcgee T on: 09/04/2011 04:40 PM   Modules accepted: Orders

## 2011-09-04 NOTE — Progress Notes (Signed)
Terri Hart [birth year] [id number]   History:    55 y.o.  for annual exam patient with past history of osteoporosis had been on Actonel 150 mg from 2009 until approximately 2 months ago her last bone density study in November 2010 it showed she had significant improvement on her bone mineralization at the AP spine and left hip and right hip were stable. Her lowest T score was in the decrease bone mineralization (osteopenia) range with a T score of -1.9. She stopped the medication her own with concerns of when she her for the medial potential side effects. Review record and indicated she was weighing 135 is now weighing 139 with a BMI of 25.04 patient has history of celiac sprue and has had vitamin D deficiency in the past. She currently takes vitamin D 3 cholecalciferol 50,000 units q. monthly. Her last colonoscopy was in 2007 which was normal and her mammogram was normal done this year and she does her monthly self breast examination.  Past medical history,surgical history, family history and social history were all reviewed and documented in the EPIC chart.  ROS:  Was performed and pertinent positives and negatives are included in the history.  Exam: chaperone present BP 132/84  Ht 5' 2.5" (1.588 m)  Wt 139 lb (63.05 kg)  BMI 25.02 kg/m2  Body mass index is 25.02 kg/(m^2).  General appearance : Well developed well nourished female. No acute distress HEENT: Neck supple, trachea midline, no carotid bruits, no thyroidmegaly Lungs: Clear to auscultation, no rhonchi or wheezes, or rib retractions  Heart: Regular rate and rhythm, no murmurs or gallops Breast:Examined in sitting and supine position were symmetrical in appearance, no palpable masses or tenderness,  no skin retraction, no nipple inversion, no nipple discharge, no skin discoloration, no axillary or supraclavicular lymphadenopathy Abdomen: no palpable masses or tenderness, no rebound or guarding Extremities: no edema or skin  discoloration or tenderness  Pelvic:  Bartholin, Urethra, Skene Glands: Within normal limits             Vagina: No gross lesions or discharge  Cervix: No gross lesions or discharge  Uterus  anteverted, normal size, shape and consistency, non-tender and mobile  Adnexa  Without masses or tenderness  Anus and perineum  normal   Rectovaginal  normal sphincter tone without palpated masses or tenderness psoriasis and small tears near perirectal region             Hemoccult not done     Assessment/Plan:  55 y.o. female for annual exam with history of celiac sprue and vitamin D deficiency. At had history of osteoporosis which improved on the Actonel which she took for almost 3 years. Patient discontinued the medication spontaneously because of concerns of potential side effects. We had a lengthy discussion of the risks benefits and pros and cons of anti-result of agent and potential risk such as spontaneous fractures of the femur and osteonecrosis of the jaw. We'll continue to monitor with bone density studies every 2 years she was scheduled for this November. She was instructed to continue with her calcium and vitamin D as she was taking. She is taking vitamin D 3 cholecalciferol 50,000 units q. monthly. We'll do a comprehensive metabolic panel to look at her liver function tests since she is on Zocor for hyperlipidemia. Will check her vitamin D level because of her past history vitamin D deficiency and since she's fasting we'll do a fasting lipid profile today as well and check her CBC urinalysis and  Pap smear. She was encouraged to engage in regular exercise activity as well as the diet low in fat high in fiber protein vegetables and fruit. Will notify her if there is any abnormality of any the above-mentioned test otherwise will see her in a year or when necessary. She will return in November for bone density study. She was given her fecal occult blood testing cards to submit to the office in 2 weeks once  the small tears perirectal for her psoriasis has healed. She is currently on a steroid cream for this.   Ok Edwards MD, 3:55 PM 09/04/2011

## 2011-09-05 ENCOUNTER — Telehealth: Payer: Self-pay | Admitting: *Deleted

## 2011-09-05 DIAGNOSIS — N390 Urinary tract infection, site not specified: Secondary | ICD-10-CM

## 2011-09-05 LAB — VITAMIN D 25 HYDROXY (VIT D DEFICIENCY, FRACTURES): Vit D, 25-Hydroxy: 39 ng/mL (ref 30–89)

## 2011-09-05 MED ORDER — NITROFURANTOIN MONOHYD MACRO 100 MG PO CAPS: 100.0000 mg | ORAL_CAPSULE | Freq: Two times a day (BID) | ORAL | Status: DC

## 2011-09-05 NOTE — Telephone Encounter (Signed)
Message copied by Aura Camps on Thu Sep 05, 2011 11:23 AM ------      Message from: Ok Edwards      Created: Thu Sep 05, 2011 10:22 AM       Victorino Dike, I need to check for this patient to see if she has a primary physician (internist or family practice doctor) to 6 that she sees. Tell that was review of her labs and her total cholesterol was elevated at 202 normal is less then 200. Her HDL was normal which is a good cholesterol. Triglycerides nevertheless were elevated at 233 normal range is 40-160. Her LDL was normal in the 113 the cholesterol/HDL ratio was 4.7 normal being less than 4.5. Her comprehensive metabolic panel had demonstrated that her liver function tests or elevated the SGOT and SGPT were elevated at 64 and 85 respectively normal range is being less than 37. Also wanted to check with Coy Saunas to see if from her blood work at the date yesterday that we can run a hepatitis panel blood test. Please let been know we may need to refer to gastroenterologist for further evaluation.

## 2011-09-05 NOTE — Telephone Encounter (Signed)
PT INFORMED WITH ALL THE BELOW NOTE. SHE WILL FOLLOW UP WITH HER PCP.

## 2011-09-06 ENCOUNTER — Telehealth: Payer: Self-pay | Admitting: *Deleted

## 2011-09-06 DIAGNOSIS — N390 Urinary tract infection, site not specified: Secondary | ICD-10-CM

## 2011-09-06 MED ORDER — AMPICILLIN 250 MG PO CAPS: 250.0000 mg | ORAL_CAPSULE | Freq: Three times a day (TID) | ORAL | Status: AC

## 2011-09-06 NOTE — Telephone Encounter (Signed)
Pt called concerned about the side effects from the Macrobid rx given on 09/05/11. Pt said side effects were eye pain, liver problems. She has celiac disease & elevated liver function result from office visit from 09/04/11 and she wants to make sure that it is safe to take rx. Pt says she is taking doxycycline hyclate & neoploydex from her eye doctor due to eye issues. Please advise if pt is okay to take rx.

## 2011-09-06 NOTE — Telephone Encounter (Signed)
Lm detailed message on pt voice mail with the below note, rx sent to pharmacy

## 2011-09-06 NOTE — Telephone Encounter (Signed)
PT CALLED WANTING TO KNOW DUE TO HER CELIAC DISEASE WAS IT SAFE TO TAKE A MEDICATION THAT HER EYE DOCTOR GAVE HER? LM FOR PT TO CALL BACK.

## 2011-09-06 NOTE — Telephone Encounter (Signed)
Message copied by Aura Camps on Fri Sep 06, 2011 12:51 PM ------      Message from: Ok Edwards      Created: Fri Sep 06, 2011 12:23 PM       Victorino Dike. Tell patient not to take the Macrobid to sleep recently found out that her liver function tests were elevated. Please call and ampicillin 250 mg one tablet 3 times a day for 5 days if she has no allergies.

## 2011-09-13 ENCOUNTER — Encounter: Payer: Self-pay | Admitting: Gynecology

## 2011-10-01 ENCOUNTER — Other Ambulatory Visit: Payer: Self-pay | Admitting: *Deleted

## 2011-10-01 DIAGNOSIS — Z1211 Encounter for screening for malignant neoplasm of colon: Secondary | ICD-10-CM

## 2011-10-01 LAB — POC HEMOCCULT BLD/STL (OFFICE/1-CARD/DIAGNOSTIC): Fecal Occult Blood, POC: NEGATIVE

## 2011-11-18 ENCOUNTER — Other Ambulatory Visit: Payer: Self-pay | Admitting: Gynecology

## 2011-11-18 DIAGNOSIS — M8580 Other specified disorders of bone density and structure, unspecified site: Secondary | ICD-10-CM

## 2011-12-05 ENCOUNTER — Ambulatory Visit (INDEPENDENT_AMBULATORY_CARE_PROVIDER_SITE_OTHER): Payer: 59 | Admitting: Gynecology

## 2011-12-05 DIAGNOSIS — M899 Disorder of bone, unspecified: Secondary | ICD-10-CM

## 2011-12-05 DIAGNOSIS — M810 Age-related osteoporosis without current pathological fracture: Secondary | ICD-10-CM

## 2011-12-05 DIAGNOSIS — M8580 Other specified disorders of bone density and structure, unspecified site: Secondary | ICD-10-CM

## 2011-12-06 ENCOUNTER — Ambulatory Visit (INDEPENDENT_AMBULATORY_CARE_PROVIDER_SITE_OTHER): Payer: 59 | Admitting: Family Medicine

## 2011-12-06 ENCOUNTER — Encounter: Payer: Self-pay | Admitting: Family Medicine

## 2011-12-06 DIAGNOSIS — R7989 Other specified abnormal findings of blood chemistry: Secondary | ICD-10-CM

## 2011-12-06 DIAGNOSIS — B353 Tinea pedis: Secondary | ICD-10-CM

## 2011-12-06 DIAGNOSIS — R945 Abnormal results of liver function studies: Secondary | ICD-10-CM

## 2011-12-06 DIAGNOSIS — Z0000 Encounter for general adult medical examination without abnormal findings: Secondary | ICD-10-CM

## 2011-12-06 DIAGNOSIS — E781 Pure hyperglyceridemia: Secondary | ICD-10-CM

## 2011-12-06 LAB — TRIGLYCERIDES: Triglycerides: 142 mg/dL (ref <150)

## 2011-12-06 LAB — HEPATIC FUNCTION PANEL
AST: 28 U/L (ref 0–37)
Alkaline Phosphatase: 77 U/L (ref 39–117)

## 2011-12-06 NOTE — Progress Notes (Signed)
  Subjective:    Patient ID: Terri Hart, female    DOB: September 19, 1956, 55 y.o.   MRN: [id number]  HPI CPE- UTD on GYN, UTD on colonoscopy  Elevated triglycerides- elevated in labs done in September.  Not currently on meds.  Is attempting to control w/ diet and exercise- started walking on lunch hour.  Elevated LFTs- found on labs done by GYN in September.  L foot pain- between 4th and 5th toe, has 'white pus'.  Has been present for 4 weeks.  Review of Systems Patient reports no vision/ hearing changes, adenopathy,fever, weight change,  persistant/recurrent hoarseness , swallowing issues, chest pain, palpitations, edema, persistant/recurrent cough, hemoptysis, dyspnea (rest/exertional/paroxysmal nocturnal), gastrointestinal bleeding (melena, rectal bleeding), abdominal pain, significant heartburn, bowel changes, GU symptoms (dysuria, hematuria, incontinence), Gyn symptoms (abnormal  bleeding, pain),  syncope, focal weakness, memory loss, numbness & tingling, skin/hair/nail changes, abnormal bruising or bleeding, anxiety, or depression.     Objective:   Physical Exam General Appearance:    Alert, cooperative, no distress, appears stated age  Head:    Normocephalic, without obvious abnormality, atraumatic  Eyes:    PERRL, conjunctiva/corneas clear, EOM's intact, fundi    benign, both eyes  Ears:    Normal TM's and external ear canals, both ears  Nose:   Nares normal, septum midline, mucosa normal, no drainage    or sinus tenderness  Throat:   Lips, mucosa, and tongue normal; teeth and gums normal  Neck:   Supple, symmetrical, trachea midline, no adenopathy;    Thyroid: no enlargement/tenderness/nodules  Back:     Symmetric, no curvature, ROM normal, no CVA tenderness  Lungs:     Clear to auscultation bilaterally, respirations unlabored  Chest Wall:    No tenderness or deformity   Heart:    Regular rate and rhythm, S1 and S2 normal, no murmur, rub   or gallop  Breast Exam:    Deferred  to GYN  Abdomen:     Soft, non-tender, bowel sounds active all four quadrants,    no masses, no organomegaly  Genitalia:    Deferred to GYN  Rectal:    Extremities:   Extremities normal, atraumatic, no cyanosis or edema  Pulses:   2+ and symmetric all extremities  Skin:   Skin color, texture, turgor normal, fungal infxn between L 4th and 5th toes  Lymph nodes:   Cervical, supraclavicular, and axillary nodes normal  Neurologic:   CNII-XII intact, normal strength, sensation and reflexes    throughout          Assessment & Plan:

## 2011-12-06 NOTE — Patient Instructions (Signed)
We'll notify you of your lab results Apply Clotrimazole cream twice daily until your foot improves (this is a fungal infection) You look great!  Keep up the good work! Call with any questions or concerns Happy Holidays!!!

## 2011-12-08 DIAGNOSIS — B353 Tinea pedis: Secondary | ICD-10-CM | POA: Insufficient documentation

## 2011-12-08 NOTE — Assessment & Plan Note (Signed)
Found on recent labs at GYN.  Will repeat today to determine if hepatitis panel or [place] is needed

## 2011-12-08 NOTE — Assessment & Plan Note (Signed)
Educated pt as to diagnosis.  Start clotrimazole bid.  Reviewed supportive care and red flags that should prompt return.  Pt expressed understanding and is in agreement w/ plan.

## 2011-12-08 NOTE — Assessment & Plan Note (Signed)
Pt's PE WNL w/ exception of fungal infxn.  EKG done- see document for interpretation.  UTD on health maintenance.  Reviewed labs from GYN.  Anticipatory guidance provided.

## 2011-12-08 NOTE — Assessment & Plan Note (Signed)
Discovered on recent labs by GYN.  Recheck- start meds prn.

## 2012-01-02 ENCOUNTER — Other Ambulatory Visit: Payer: 59 | Admitting: Gynecology

## 2012-01-02 ENCOUNTER — Other Ambulatory Visit: Payer: Self-pay | Admitting: Gynecology

## 2012-01-03 ENCOUNTER — Telehealth: Payer: Self-pay | Admitting: Family Medicine

## 2012-01-03 LAB — PTH, INTACT AND CALCIUM
Calcium, Total (PTH): 9.5 mg/dL (ref 8.4–10.5)
PTH: 71 pg/mL (ref 14.0–72.0)

## 2012-01-03 LAB — VITAMIN D 25 HYDROXY (VIT D DEFICIENCY, FRACTURES): Vit D, 25-Hydroxy: 34 ng/mL (ref 30–89)

## 2012-01-03 MED ORDER — CLOTRIMAZOLE-BETAMETHASONE 1-0.05 % EX CREA: TOPICAL_CREAM | Freq: Two times a day (BID) | CUTANEOUS | Status: AC

## 2012-01-03 NOTE — Telephone Encounter (Signed)
Last OV 12-06-11 noted given Clotrimazole Cream for DX: fungal infection

## 2012-01-03 NOTE — Telephone Encounter (Signed)
Patient is requesting to have an rx called in for fungus between toes that is not going away from previous visit. Pharmacy [facility]. Please assist and call patient back with advise.

## 2012-01-03 NOTE — Telephone Encounter (Signed)
Received written approval from MD Alwyn Ren to send another Rx to pt, [facility] [address]

## 2012-01-16 ENCOUNTER — Institutional Professional Consult (permissible substitution): Payer: 59 | Admitting: Gynecology

## 2012-01-17 ENCOUNTER — Encounter: Payer: Self-pay | Admitting: Gynecology

## 2012-01-17 ENCOUNTER — Ambulatory Visit (INDEPENDENT_AMBULATORY_CARE_PROVIDER_SITE_OTHER): Payer: 59 | Admitting: Gynecology

## 2012-01-17 DIAGNOSIS — M810 Age-related osteoporosis without current pathological fracture: Secondary | ICD-10-CM

## 2012-01-17 NOTE — Patient Instructions (Signed)
Osteoporosis Osteoporosis is a disease of the bones that makes them weaker and prone to break (fracture). By their mid-30s, most people begin to gradually lose bone strength. If this is severe enough, osteoporosis may occur. Osteopenia is a less severe weakness of the bones, which places you at risk for osteoporosis. It is important to identify if you have osteoporosis or osteopenia. Bone fractures from osteoporosis (especially hip and spine fractures) are a major cause of hospitalization, loss of independence, and can lead to life-threatening complications. CAUSES  There are a number of causes and risk factors:  Gender. Women are at a higher risk for osteoporosis than men.   Age. Bone formation slows down with age.   Ethnicity. For unclear reasons, white and Asian women are at higher risk for osteoporosis. Hispanic and African American women are at increased, but lesser, risk.   Family history of osteoporosis can mean that you are at a higher risk for getting it.   History of bone fractures indicates you may be at higher risk of another.   Calcium is very important for bone health and strength. Not enough calcium in your diet increases your risk for osteoporosis. Vitamin D is important for calcium metabolism. You get vitamin D from sunlight, foods, or supplements.   Physical activity. Bones get stronger with weight-bearing exercise and weaker without use.   Smoking is associated with decreased bone strength.   Medicines. Cortisone medicines, too much thyroid medicine, some cancer and seizure medicines, and others can weaken bones and cause osteoporosis.   Decreased body weight is associated with osteoporosis. The small amount of estrogen-type molecules produced in fat cells seems to protect the bones.   Menopausal decrease in the hormone estrogen can cause osteoporosis.   Low levels of the hormone testosterone can cause osteoporosis.   Some medical conditions can lead to osteoporosis  (hyperthyroidism, hyperparathyroidism, B12 deficiency).  SYMPTOMS  Usually, no symptoms are felt as the bones weaken. The first symptoms are generally related to bone fractures. You may have silent, tiny bone fractures, especially in your spine. This can cause height loss and forward bending of the spine (kyphosis). DIAGNOSIS  You or your caregiver may suspect osteoporosis based on height loss and kyphosis. Osteoporosis or osteopenia may be identified on an X-ray done for other reasons. A bone density measurement will likely be taken. Your bones are often measured at your lower spine or your hips. Measurement is done by an X-ray called a DEXA scan, or sometimes by a computerized X-ray scan (CT or CAT scan). Other tests may be done to find the cause of osteoporosis, such as blood tests to measure calcium and vitamin D, or to monitor treatment. TREATMENT  The goal of osteoporosis treatment is to prevent fractures. This is done through medicine and home care treatments. Treatment will slow the weakening of your bones and strengthen them where possible. Measures to decrease the likelihood of falling and fracturing a bone are also important. Medicine  You may need supplements if you are not getting enough calcium, vitamin D, and vitamin B12.   If you are female and menopausal, you should discuss the option of estrogen replacement or estrogen-like medicine with your caregiver.   Medicines can be taken by mouth or injection to help build bone strength. When taken by mouth, there are important directions that you need to follow.   Calcitonin is a hormone made by the thyroid gland that can help build bone strength and decrease fracture risk in the spine. It   can be taken by nasal spray or injection.   Parathyroid hormone can be injected to help build bone strength.   You will need to continue to get enough calcium intake with any of these medicines.  FALL PREVENTION  If you are unsteady on your feet, use  a cane, walker, or walk with someone's help.   Remove loose rugs or electrical cords from your home.   Keep your home well lit at night. Use glasses if you need them.   Avoid icy streets and wet or waxed floors.   Hold the railing when using stairs.   Watch out for your pets.   Install grab bars in your bathroom.   Exercise. Physical activity, especially weight-bearing exercise, helps strengthen bones. Strength and balance exercise, such as tai chi, helps prevent falls.   Alcohol and some medicines can make you more likely to fall. Discuss alcohol use with your caregiver. Ask your caregiver if any of your medicines might increase your risk for falling. Ask if safer alternatives are available.  HOME CARE INSTRUCTIONS   Try to prevent and avoid falls.   To pick up objects, bend at the knees. Do not bend with your back.   Do not smoke. If you smoke, ask for help to stop.   Have adequate calcium and vitamin D in your diet. Talk with your caregiver about amounts.   Before exercising, ask your caregiver what exercises will be good for you.   Only take over-the-counter or prescription medicines for pain, discomfort, or fever as directed by your caregiver.  SEEK MEDICAL CARE IF:   You have had a fracture and your pain is not controlled.   You have had a fracture and you are not able to return to activities as expected.   You are reinjured.   You develop side effects from medicines, especially stomach pain or trouble swallowing.   You develop new, unexplained problems.  SEEK IMMEDIATE MEDICAL CARE IF:   You develop sudden, severe pain in your back.   You develop pain after an injury or fall.  Document Released: 09/25/2005 Document Revised: 08/28/2011 Document Reviewed: 08/13/2007 Exit[employer] Patient Information 2012 [employer]. 

## 2012-01-17 NOTE — Progress Notes (Signed)
Patient presented to the office today for consultation in reference to her recent bone density study done here in the office December 2012. Patient's significant medical history as follows:  Crohn's disease diagnosed in college Celiac sprue diagnosed in 2009 Vitamin D deficiency diagnosed in 2009 Osteoporosis first diagnosed 2009  Patient in 2008 had compliance issues with her Actonel and sister was diagnosed with osteoporosis. She took the Actonel for 3 years and discontinued it on her own in the summer of 2012 after reading potential side effects from the medication. She has been taking her Caltrate plus once a day and walks regularly she is currently on a gluten free diet.  Bone density study December 2012 (6 months off Actonel) with significant decrease in bone mineralization in the AP spine -8.7% with a T score -2.5. Total right hip was significant decrease in bone mineralization of -7.2% T score -1.6. The remainder of the regions of interest were all in the decreased bone mineralization (osteopenia) range. Patient on no hormone replacement therapy and no prior history of fracture. Patient thin Caucasian 5 feet 2 inches tall weight 139 pounds BMI 25.02.  We discussed today in consultation the decreased bone mineralization and the diagnosis of osteoporosis worsening. Her bone masses more than 25% below normal and she has an 8 times greater risk of a spine fracture and 7 times greater risk of fracture to the hip. We would expect it had it continued positive response from the Actonel since she was on it for 3 years and discontinued at 6 months ago. 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 We discussed Forteo and Prolia. The risks benefits and pros and cons of the medications were discussed. Patient's recent calcium vitamin D and PTH were normal. We'll make arrangements for her to receive Prolia 60 mg subcutaneous Q6 months. We will  recommended she take her Caltrate twice a day instead of once a day and to continue her regular weightbearing exercises and repeat her bone density one year after initiation of above treatment recommendation. Literature information was provided today.

## 2012-01-22 ENCOUNTER — Encounter: Payer: Self-pay | Admitting: *Deleted

## 2012-01-22 NOTE — Progress Notes (Signed)
Patient ID: Terri Hart, female   DOB: 1956-05-04, 56 y.o.   MRN: [id number] Left message for pt to call, she left message on vm regarding something with appt. With dr. Loreta Ave?

## 2012-01-27 ENCOUNTER — Telehealth: Payer: Self-pay | Admitting: *Deleted

## 2012-01-27 NOTE — Telephone Encounter (Signed)
Message copied by Libby Maw on Mon Jan 27, 2012 10:47 AM ------      Message from: Ok Edwards      Created: Caleen Essex Jan 17, 2012 10:18 AM       Rueben Kassim, this is a patient I was telling earlier that needs to be put on Prolia. Please check her insurance coverage and referred to my encounter note from today's visit. Thank you

## 2012-01-27 NOTE — Telephone Encounter (Signed)
Lm for patient to call about Reclast benefits.  Patient cost will be approx $441.

## 2012-01-28 NOTE — Telephone Encounter (Signed)
Patient informed on cost.  She said she will think about it, but still not sure if she will go with it.  Concerned about side effects.  Will let us know what she decides.

## 2012-01-29 ENCOUNTER — Telehealth: Payer: Self-pay | Admitting: *Deleted

## 2012-01-29 NOTE — Telephone Encounter (Signed)
Amy, I will need to speak with Dr. Loreta Ave this morning are this afternoon in reference to this patient. There is no contraindication for her to receive the Prolia in [place] of her having celiac sprue but I we'll run a by her.

## 2012-01-29 NOTE — Telephone Encounter (Signed)
Had spoke to patient yesterday about benefits for Prolia.  She stated she wanted to think about it and let us know.  Today she calls and says she wants you to speak to Dr. Loreta Ave first before she decides to actually proceed with this injection.  She said she has issues with "Celiac disease" and wants to make sure its ok because she has read up on the risks.

## 2012-02-03 NOTE — Telephone Encounter (Signed)
Dr. Glenetta Hew, is there something I need to tell the patient about this message?  We had gotten Dr. Loreta Ave on the phone the other day.

## 2012-02-03 NOTE — Telephone Encounter (Signed)
Lm for patient to call

## 2012-02-03 NOTE — Telephone Encounter (Signed)
Terri Hart, please refer  to encounter note from January 18. I spoke with Dr. Loreta Ave there is no contraindication for this patient to receive her Prolia. All her lab work have been drawn. Please check for verification so that she can start receiving the medication subcutaneous every 6 months here in the office. Please inform patient thank you

## 2012-02-04 NOTE — Telephone Encounter (Signed)
Patient informed.  Ok with going with shot.  Will order Prolia and patient will call when she has the funds together for the shot to schedule appt.  Will need $413 at check in.

## 2012-02-24 NOTE — Telephone Encounter (Signed)
Patient called and said she decided she will not go with shot at this time.  She will call when she is able.  She would need $413 to receive injection.

## 2012-08-21 ENCOUNTER — Encounter: Payer: Self-pay | Admitting: Gynecology

## 2012-08-26 ENCOUNTER — Encounter: Payer: Self-pay | Admitting: Family Medicine

## 2012-12-07 ENCOUNTER — Encounter: Payer: Self-pay | Admitting: Family Medicine

## 2012-12-07 ENCOUNTER — Ambulatory Visit (INDEPENDENT_AMBULATORY_CARE_PROVIDER_SITE_OTHER): Payer: 59 | Admitting: Family Medicine

## 2012-12-07 VITALS — BP 140/70 | HR 67 | Temp 98.2°F | Ht 62.75 in | Wt 138.6 lb

## 2012-12-07 DIAGNOSIS — E559 Vitamin D deficiency, unspecified: Secondary | ICD-10-CM

## 2012-12-07 DIAGNOSIS — Z0000 Encounter for general adult medical examination without abnormal findings: Secondary | ICD-10-CM

## 2012-12-07 LAB — LIPID PANEL
Total CHOL/HDL Ratio: 5
VLDL: 24.4 mg/dL (ref 0.0–40.0)

## 2012-12-07 LAB — BASIC METABOLIC PANEL
Chloride: 105 mEq/L (ref 96–112)
Potassium: 4.7 mEq/L (ref 3.5–5.1)
Sodium: 139 mEq/L (ref 135–145)

## 2012-12-07 LAB — CBC WITH DIFFERENTIAL/PLATELET
Basophils Absolute: 0.1 10*3/uL (ref 0.0–0.1)
Basophils Relative: 0.8 % (ref 0.0–3.0)
Eosinophils Absolute: 0.1 10*3/uL (ref 0.0–0.7)
Lymphocytes Relative: 29.5 % (ref 12.0–46.0)
MCHC: 33.2 g/dL (ref 30.0–36.0)
Neutrophils Relative %: 60.3 % (ref 43.0–77.0)
RBC: 4.38 Mil/uL (ref 3.87–5.11)
RDW: 13.3 % (ref 11.5–14.6)

## 2012-12-07 LAB — HEPATIC FUNCTION PANEL
ALT: 51 U/L — ABNORMAL HIGH (ref 0–35)
AST: 36 U/L (ref 0–37)
Alkaline Phosphatase: 77 U/L (ref 39–117)
Bilirubin, Direct: 0 mg/dL (ref 0.0–0.3)
Total Bilirubin: 0.5 mg/dL (ref 0.3–1.2)

## 2012-12-07 NOTE — Progress Notes (Signed)
  Subjective:    Patient ID: Terri Hart, female    DOB: [birth year], 56 y.o.   MRN: [id number]  HPI CPE- UTD on pap, mammo, DEXA, colonoscopy.  No concerns today.   Review of Systems Patient reports no vision/ hearing changes, adenopathy,fever, weight change,  persistant/recurrent hoarseness , swallowing issues, chest pain, palpitations, edema, persistant/recurrent cough, hemoptysis, dyspnea (rest/exertional/paroxysmal nocturnal), gastrointestinal bleeding (melena, rectal bleeding), abdominal pain, significant heartburn, bowel changes, GU symptoms (dysuria, hematuria, incontinence), Gyn symptoms (abnormal  bleeding, pain),  syncope, focal weakness, memory loss, numbness & tingling, skin/hair/nail changes, abnormal bruising or bleeding, anxiety, or depression.     Objective:   Physical Exam General Appearance:    Alert, cooperative, no distress, appears stated age  Head:    Normocephalic, without obvious abnormality, atraumatic  Eyes:    PERRL, conjunctiva/corneas clear, EOM's intact, fundi    benign, both eyes  Ears:    Normal TM's and external ear canals, both ears  Nose:   Nares normal, septum midline, mucosa normal, no drainage    or sinus tenderness  Throat:   Lips, mucosa, and tongue normal; teeth and gums normal  Neck:   Supple, symmetrical, trachea midline, no adenopathy;    Thyroid: no enlargement/tenderness/nodules  Back:     Symmetric, no curvature, ROM normal, no CVA tenderness  Lungs:     Clear to auscultation bilaterally, respirations unlabored  Chest Wall:    No tenderness or deformity   Heart:    Regular rate and rhythm, S1 and S2 normal, no murmur, rub   or gallop  Breast Exam:    Deferred to GYN  Abdomen:     Soft, non-tender, bowel sounds active all four quadrants,    no masses, no organomegaly  Genitalia:    Deferred to GYN  Rectal:    Extremities:   Extremities normal, atraumatic, no cyanosis or edema  Pulses:   2+ and symmetric all extremities  Skin:   Skin  color, texture, turgor normal, no rashes or lesions  Lymph nodes:   Cervical, supraclavicular, and axillary nodes normal  Neurologic:   CNII-XII intact, normal strength, sensation and reflexes    throughout          Assessment & Plan:

## 2012-12-07 NOTE — Patient Instructions (Addendum)
Follow up in 1 year or as needed You look great!  Keep up the good work! 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 Happy Holidays!!!

## 2012-12-09 ENCOUNTER — Ambulatory Visit: Payer: 59

## 2012-12-09 DIAGNOSIS — R7309 Other abnormal glucose: Secondary | ICD-10-CM

## 2012-12-09 LAB — HEMOGLOBIN A1C: Hgb A1c MFr Bld: 6.7 % — ABNORMAL HIGH (ref 4.6–6.5)

## 2012-12-10 LAB — VITAMIN D 1,25 DIHYDROXY
Vitamin D2 1, 25 (OH)2: <8 pg/mL
Vitamin D3 1, 25 (OH)2: 96 pg/mL

## 2012-12-11 ENCOUNTER — Telehealth: Payer: Self-pay | Admitting: *Deleted

## 2012-12-11 DIAGNOSIS — E785 Hyperlipidemia, unspecified: Secondary | ICD-10-CM

## 2012-12-11 MED ORDER — ATORVASTATIN CALCIUM 20 MG PO TABS: 20.0000 mg | ORAL_TABLET | Freq: Every day | ORAL | Status: DC

## 2012-12-11 NOTE — Telephone Encounter (Signed)
Message copied by Verdie Shire on Fri Dec 11, 2012  3:19 PM ------      Message from: Sheliah Hatch      Created: Mon Dec 07, 2012  8:33 PM       Glucose is elevated- please add A1C to labs      Total cholesterol, LDL are both elevated compared to last check.  Based on this, would recommend starting Lipitor 20mg  nightly, recheck LFTs in 6-8 weeks at lab visit (dx 272.4) and repeat lipids in 6 months at OV

## 2012-12-11 NOTE — Telephone Encounter (Signed)
Spoke with the pt and informed her of the recent Lipid results and note.  Pt understood and agreed.  She stated will call back to schedule lab appt after checking her work schedule.   New rx was e-scribed to pt's pharmacy.//AB/CMA

## 2013-02-08 ENCOUNTER — Telehealth: Payer: Self-pay | Admitting: Family Medicine

## 2013-02-08 DIAGNOSIS — E785 Hyperlipidemia, unspecified: Secondary | ICD-10-CM

## 2013-02-08 MED ORDER — ATORVASTATIN CALCIUM 20 MG PO TABS: 20.0000 mg | ORAL_TABLET | Freq: Every day | ORAL | Status: AC

## 2013-02-08 NOTE — Telephone Encounter (Signed)
[facility]atin [facility]

## 2013-02-08 NOTE — Telephone Encounter (Signed)
Pharmacy is requesting 90-day supply on  Atorvastatin 20mg  tablets

## 2013-03-01 ENCOUNTER — Encounter: Payer: Self-pay | Admitting: Family Medicine

## 2013-03-01 ENCOUNTER — Ambulatory Visit: Payer: 59 | Admitting: Family Medicine

## 2013-03-01 ENCOUNTER — Ambulatory Visit (INDEPENDENT_AMBULATORY_CARE_PROVIDER_SITE_OTHER): Payer: 59 | Admitting: Family Medicine

## 2013-03-01 VITALS — BP 140/88 | HR 81 | Temp 97.3°F | Ht 62.25 in | Wt 137.8 lb

## 2013-03-01 DIAGNOSIS — M545 Low back pain, unspecified: Secondary | ICD-10-CM | POA: Insufficient documentation

## 2013-03-01 DIAGNOSIS — E1110 Type 2 diabetes mellitus with ketoacidosis without coma: Secondary | ICD-10-CM

## 2013-03-01 DIAGNOSIS — E785 Hyperlipidemia, unspecified: Secondary | ICD-10-CM

## 2013-03-01 LAB — POCT URINALYSIS DIPSTICK
Blood, UA: NEGATIVE
Glucose, UA: NEGATIVE
Nitrite, UA: NEGATIVE
Protein, UA: NEGATIVE
Spec Grav, UA: >=1.03
Urobilinogen, UA: 0.2

## 2013-03-01 MED ORDER — NAPROXEN 500 MG PO TABS: 500.0000 mg | ORAL_TABLET | Freq: Two times a day (BID) | ORAL | Status: AC

## 2013-03-01 MED ORDER — CYCLOBENZAPRINE HCL 10 MG PO TABS: 10.0000 mg | ORAL_TABLET | Freq: Three times a day (TID) | ORAL | Status: AC | PRN

## 2013-03-01 NOTE — Progress Notes (Signed)
  Subjective:    Patient ID: Terri Hart, female    DOB: 01/13/1956, 57 y.o.   MRN: [id number]  HPI Back pain- started while she was walking at work.  Now having difficulty bending.  Coworker noticed that 1 shoulder was higher than the other.  Pain is concentrated over central low back.  sxs started 2 weeks ago.  Did shovel snow prior to pain.  No radiation of pain.  No weakness or numbness of legs/thighs.  No fevers.  Pain w/ forward flexion.  Pain improves w/ Aleve.  Hyperlipidemia- new dx.  Started on Lipitor at last visit.  No N/V/D, abd pain, myalgias.  DM- new dx.  Goal was to limit carbs and get regular exercise.  Not currently on meds. Has had limited exercise due recent broken wrist.    Review of Systems For ROS see HPI     Objective:   Physical Exam  Vitals reviewed. Constitutional: She is oriented to person, place, and time. She appears well-developed and well-nourished. No distress.  HENT:  Head: Normocephalic and atraumatic.  Eyes: Conjunctivae and EOM are normal. Pupils are equal, round, and reactive to [place].  Neck: Normal range of motion. Neck supple. No thyromegaly present.  Cardiovascular: Normal rate, regular rhythm, normal heart sounds and intact distal pulses.   No murmur heard. Pulmonary/Chest: Effort normal and breath sounds normal. No respiratory distress.  Abdominal: Soft. She exhibits no distension. There is no tenderness.  Musculoskeletal: She exhibits tenderness (over SI joints bilaterally, no pain w/ forward flexion, pain w/ extension). She exhibits no edema.  Lymphadenopathy:    She has no cervical adenopathy.  Neurological: She is alert and oriented to person, place, and time. She has normal reflexes. Coordination normal.  Skin: Skin is warm and dry.  Psychiatric: She has a normal mood and affect. Her behavior is normal.          Assessment & Plan:

## 2013-03-01 NOTE — Patient Instructions (Addendum)
This appears to be a low back strain Start the Naproxen twice daily- take w/ food- for 7-10 days and then as needed Use the flexeril at night- will cause sleepiness HEAT! We'll notify you of your lab results and make any changes if needed Drive Safely!

## 2013-03-02 LAB — BASIC METABOLIC PANEL
CO2: 27 mEq/L (ref 19–32)
Calcium: 9.4 mg/dL (ref 8.4–10.5)
Creatinine, Ser: 0.7 mg/dL (ref 0.4–1.2)
GFR: 93.14 mL/min (ref >60.00)
Glucose, Bld: 90 mg/dL (ref 70–99)
Sodium: 140 mEq/L (ref 135–145)

## 2013-03-02 LAB — LIPID PANEL
Cholesterol: 159 mg/dL (ref 0–200)
HDL: 46.9 mg/dL (ref >39.00)
Triglycerides: 80 mg/dL (ref 0.0–149.0)
VLDL: 16 mg/dL (ref 0.0–40.0)

## 2013-03-02 LAB — HEPATIC FUNCTION PANEL
Albumin: 4.6 g/dL (ref 3.5–5.2)
Total Protein: 7.8 g/dL (ref 6.0–8.3)

## 2013-03-02 NOTE — Assessment & Plan Note (Signed)
Chronic problem, due for labs due to recent med restart.  Check labs.  Adjust meds prn

## 2013-03-02 NOTE — Assessment & Plan Note (Signed)
New.  Pt's pain consistent w/ SI joint dysfxn/pain.  Start scheduled NSAIDs, flexeril prn.  Heat.  Reviewed supportive care and red flags that should prompt return.  Pt expressed understanding and is in agreement w/ plan.

## 2013-03-02 NOTE — Assessment & Plan Note (Signed)
New.  dx'd at last visit.  Pt has worked at limiting carbs.  Due for repeat labs.  If no improvement in A1C, start meds.  Will follow.

## 2013-03-03 ENCOUNTER — Encounter: Payer: Self-pay | Admitting: *Deleted

## 2013-10-13 ENCOUNTER — Encounter: Payer: Self-pay | Admitting: Gynecology

## 2013-10-14 ENCOUNTER — Encounter: Payer: Self-pay | Admitting: Family Medicine

## 2014-10-31 ENCOUNTER — Encounter: Payer: Self-pay | Admitting: Family Medicine

## 2015-05-22 ENCOUNTER — Other Ambulatory Visit: Payer: Self-pay | Admitting: Obstetrics and Gynecology

## 2015-05-23 LAB — CYTOLOGY - PAP

## 2016-10-25 NOTE — Progress Notes (Signed)
This encounter was created in error - please disregard.
